# Patient Record
Sex: Female | Born: 1975
Health system: Southern US, Community
[De-identification: ages and names within clinical notes are randomized; demographics above are authoritative.]

---

## 2020-08-27 ENCOUNTER — Encounter (HOSPITAL_BASED_OUTPATIENT_CLINIC_OR_DEPARTMENT_OTHER): Payer: Self-pay | Admitting: *Deleted

## 2020-08-27 ENCOUNTER — Emergency Department (HOSPITAL_BASED_OUTPATIENT_CLINIC_OR_DEPARTMENT_OTHER): Payer: BC Managed Care – PPO

## 2020-08-27 ENCOUNTER — Emergency Department (HOSPITAL_BASED_OUTPATIENT_CLINIC_OR_DEPARTMENT_OTHER)
Admission: EM | Admit: 2020-08-27 | Discharge: 2020-08-27 | Disposition: A | Discharge status: Home / Self Care | Payer: BC Managed Care – PPO | Attending: Emergency Medicine | Admitting: Emergency Medicine

## 2020-08-27 ENCOUNTER — Other Ambulatory Visit: Payer: Self-pay

## 2020-08-27 DIAGNOSIS — Y999 Unspecified external cause status: Secondary | ICD-10-CM | POA: Diagnosis not present

## 2020-08-27 DIAGNOSIS — Y939 Activity, unspecified: Secondary | ICD-10-CM | POA: Diagnosis not present

## 2020-08-27 DIAGNOSIS — Z79899 Other long term (current) drug therapy: Secondary | ICD-10-CM | POA: Insufficient documentation

## 2020-08-27 DIAGNOSIS — Y929 Unspecified place or not applicable: Secondary | ICD-10-CM | POA: Diagnosis not present

## 2020-08-27 DIAGNOSIS — S92414A Nondisplaced fracture of proximal phalanx of right great toe, initial encounter for closed fracture: Secondary | ICD-10-CM | POA: Insufficient documentation

## 2020-08-27 DIAGNOSIS — X501XXA Overexertion from prolonged static or awkward postures, initial encounter: Secondary | ICD-10-CM | POA: Insufficient documentation

## 2020-08-27 DIAGNOSIS — S92415A Nondisplaced fracture of proximal phalanx of left great toe, initial encounter for closed fracture: Secondary | ICD-10-CM

## 2020-08-27 DIAGNOSIS — S90932A Unspecified superficial injury of left great toe, initial encounter: Secondary | ICD-10-CM | POA: Diagnosis present

## 2020-08-27 MED ORDER — HYDROCODONE-ACETAMINOPHEN 5-325 MG PO TABS: 1.0000 | ORAL_TABLET | Freq: Four times a day (QID) | ORAL | 0 refills | Status: AC | PRN

## 2020-08-27 MED ORDER — OXYCODONE-ACETAMINOPHEN 5-325 MG PO TABS
1.0000 | ORAL_TABLET | Freq: Once | ORAL | Status: AC
  Administered 2020-08-27: 1 via ORAL
  Filled 2020-08-27: qty 1

## 2020-08-27 MED FILL — HYDROCODON-APAP 5-325: 5-325 | 2 days supply | Qty: 10 | Fill #0

## 2020-08-27 NOTE — Discharge Instructions (Addendum)
While you're continuing to have severe pain, recommend using crutches for mobility. As your pain improves, recommend gentle weightbearing as tolerated. Use the fracture shoe as instructed. Please schedule follow-up appointment with orthopedic surgery. Call their number to get follow-up.  Take motrin and tylenol for pain. For breakthrough pain, can use the prescribed norco. Note this can make you drowsy and should not be taken while driving or operating heavy machinery.

## 2020-08-27 NOTE — ED Triage Notes (Signed)
Jumped from bed and injured left great toe last pm  Slight swelling and bruising

## 2020-08-27 NOTE — ED Provider Notes (Signed)
[facility] EMERGENCY DEPARTMENT Provider Note   CSN: [id number] Arrival date & time: 08/27/20  3790     History Chief Complaint  Patient presents with  . Toe Pain    Virginia Bauer is a 44 y.o. female.  Presents to emergency department with concern for left great toe pain.  Patient states that last night she stepped awkwardly onto her left great toe and had sudden onset of pain.  Has had difficulty walking due to the pain since the accident.  Pain is worse with ambulation, improved with rest.  Currently dull achy, moderate at rest.  Denies any other associated injuries, denies any head trauma, pain.  She denies any chronic medical conditions.Marland Kitchen  HPI     History reviewed. No pertinent past medical history.  There are no problems to display for this patient.   History reviewed. No pertinent surgical history.   OB History   No obstetric history on file.     No family history on file.  Social History   Tobacco Use  . Smoking status: Not on file  Substance Use Topics  . Alcohol use: Not on file  . Drug use: Not on file    Home Medications Prior to Admission medications   Medication Sig Start Date End Date Taking? Authorizing Provider  levocetirizine (XYZAL) 5 MG tablet Take 5 mg by mouth every evening.   Yes [provider]  methylphenidate (RITALIN) 10 MG tablet Take 10 mg by mouth 2 (two) times daily.   Yes [provider]  omeprazole-sodium bicarbonate (ZEGERID) 40-1100 MG capsule Take 1 capsule by mouth daily before breakfast.   Yes [provider]  HYDROcodone-acetaminophen (NORCO/VICODIN) 5-325 MG tablet Take 1 tablet by mouth every 6 (six) hours as needed for up to 3 days for moderate pain. 08/27/20 08/30/20  Milagros Loll, MD    Allergies    Vancomycin  Review of Systems   Review of Systems  All other systems reviewed and are negative.   Physical Exam Updated Vital Signs BP (!) 158/105 (BP Location: Right Arm)    Pulse (!) 131 Comment: Pt takes dose of Ritalin 3 x daily  Temp 98.2 F (36.8 C) (Oral)   Resp 18   Ht 5\' 2"  (1.575 m)   Wt 83.9 kg   SpO2 97%   BMI 33.84 kg/m   Physical Exam Vitals and nursing note reviewed.  Constitutional:      Appearance: Normal appearance.  HENT:     Head: Normocephalic and atraumatic.     Mouth/Throat:     Mouth: Mucous membranes are moist.     Pharynx: Oropharynx is clear.  Eyes:     Extraocular Movements: Extraocular movements intact.     Pupils: Pupils are equal, round, and reactive to light.  Cardiovascular:     Rate and Rhythm: Normal rate.     Pulses: Normal pulses.  Pulmonary:     Effort: Pulmonary effort is normal. No respiratory distress.  Musculoskeletal:     Cervical back: Normal range of motion. No rigidity.     Comments: LLE: there is ecchymosis and TTP at base of great toe; there is no other TTP over remainder of foot, ankle, lower or upper leg, dp/pt pulses intact, distal cap refill of great toe is brisk, sensation is intact  Skin:    General: Skin is warm and dry.     Capillary Refill: Capillary refill takes less than 2 seconds.  Neurological:     General: No  focal deficit present.     Mental Status: She is alert and oriented to person, place, and time.     ED Results / Procedures / Treatments   Labs (all labs ordered are listed, but only abnormal results are displayed) Labs Reviewed - No data to display  EKG None  Radiology DG Toe Great Left  Result Date: 08/27/2020 CLINICAL DATA:  Injury EXAM: LEFT GREAT TOE COMPARISON:  None. FINDINGS: Normal alignment with approximation of the joints. Nondisplaced intra-articular fracture involving the great toe proximal phalanx. Diffuse soft tissue swelling. No suspicious radiopaque foreign bodies. IMPRESSION: Nondisplaced intra-articular fracture involving the great toe proximal phalanx. Electronically Signed   By: Stana Bunting M.D.   On: 08/27/2020 08:54     Procedures Procedures (including critical care time)  Medications Ordered in ED Medications  oxyCODONE-acetaminophen (PERCOCET/ROXICET) 5-325 MG per tablet 1 tablet (1 tablet Oral Given 08/27/20 1914)    ED Course  I have reviewed the triage vital signs and the nursing notes.  Pertinent labs & imaging results that were available during my care of the patient were reviewed by me and considered in my medical decision making (see chart for details).    MDM Rules/Calculators/A&P                          44 year old lady presents to ER with isolated great toe injury.  Plain films demonstrating nondisplaced fracture proximal phalanx of left great toe.  Neurovascular intact, no other trauma careful inspection of extremity.  Will place in fracture shoe, provided crutches. Recommended NWB for now due to her level of pain but transition to WBAT as pain improves over the next few days or so. Recommended follow-up with sports medicine or orthopedist. Provided info for sports medicine at [facility].   After the discussed management above, the patient was determined to be safe for discharge.  The patient was in agreement with this plan and all questions regarding their care were answered.  ED return precautions were discussed and the patient will return to the ED with any significant worsening of condition.   Final Clinical Impression(s) / ED Diagnoses Final diagnoses:  Closed nondisplaced fracture of proximal phalanx of left great toe, initial encounter    Rx / DC Orders ED Discharge Orders         Ordered    HYDROcodone-acetaminophen (NORCO/VICODIN) 5-325 MG tablet  Every 6 hours PRN        08/27/20 0907           Milagros Loll, MD 08/28/20 1202

## 2020-10-01 ENCOUNTER — Telehealth: Payer: Self-pay | Admitting: Family Medicine

## 2020-10-01 NOTE — Telephone Encounter (Signed)
ED follow-up -- Called pt to offer f/u care for foot/ toe pain --no answer, left message for pt to contact office if appt still needed.  -glh

## 2021-07-29 IMAGING — CR DG TOE GREAT 2+V*L*
3 series · 3 of 3 positions shown · non-contrast
Comparison: None.

CLINICAL DATA: Injury

EXAM:
LEFT GREAT TOE

[t toes ap left]
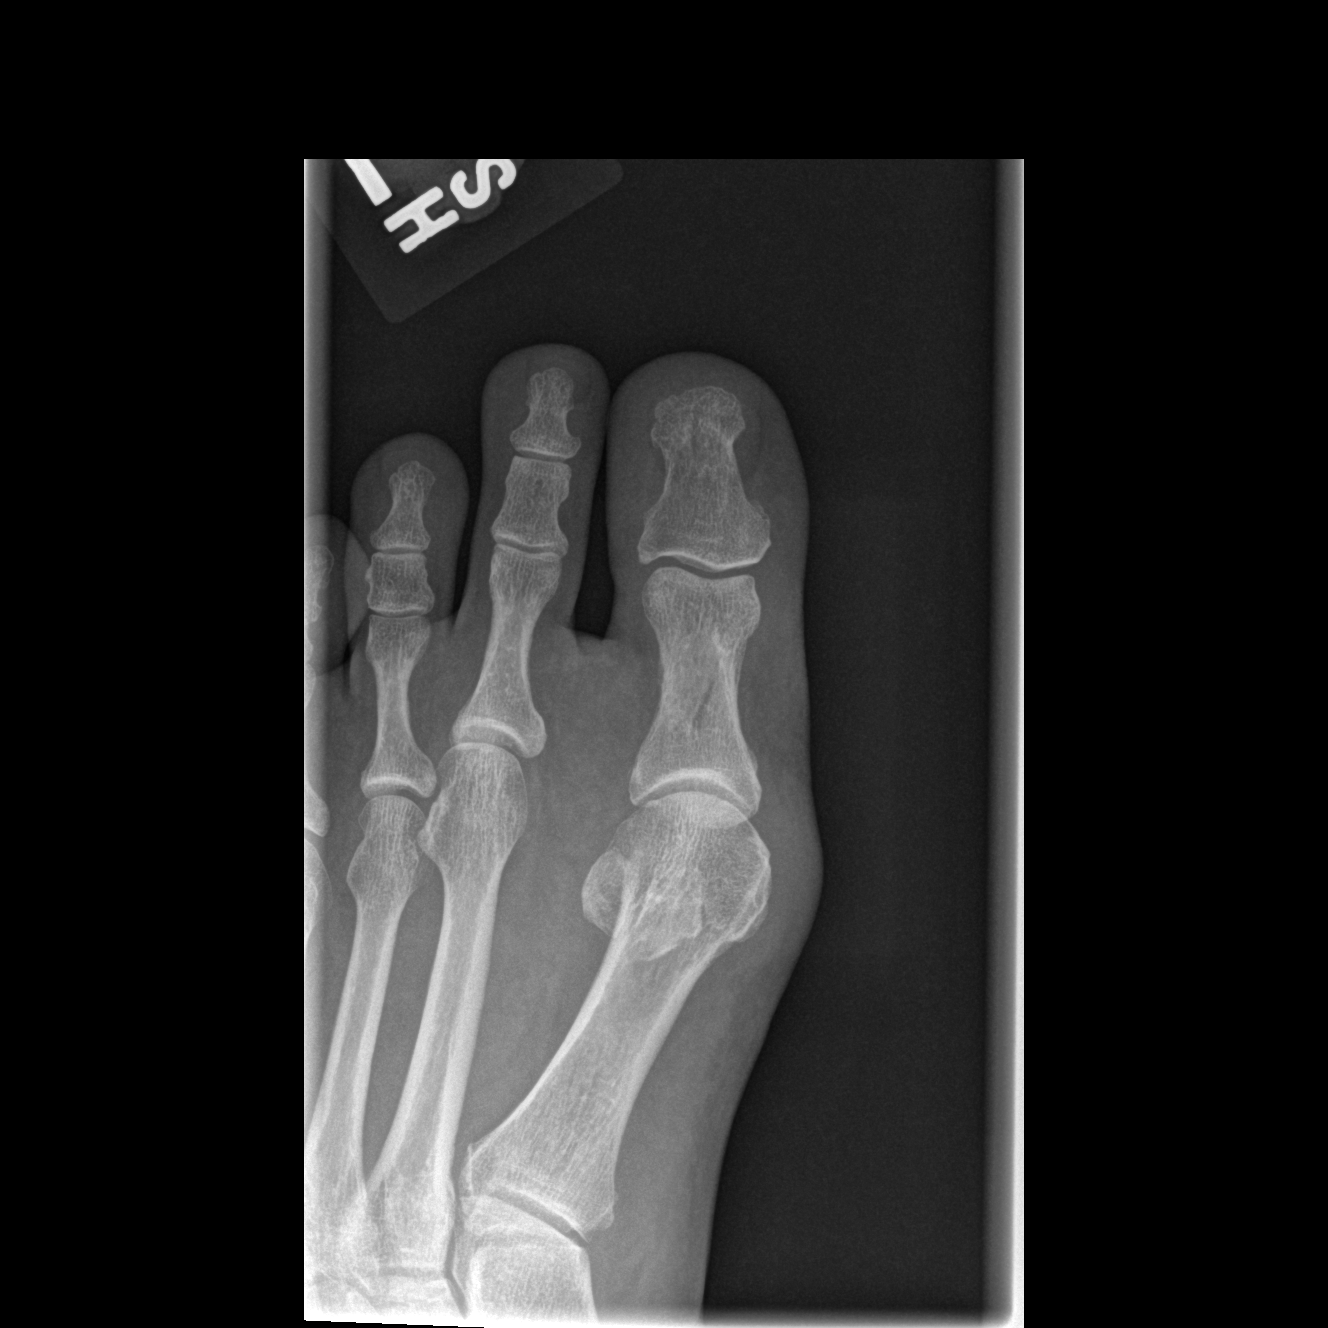

[t toes oblique left]
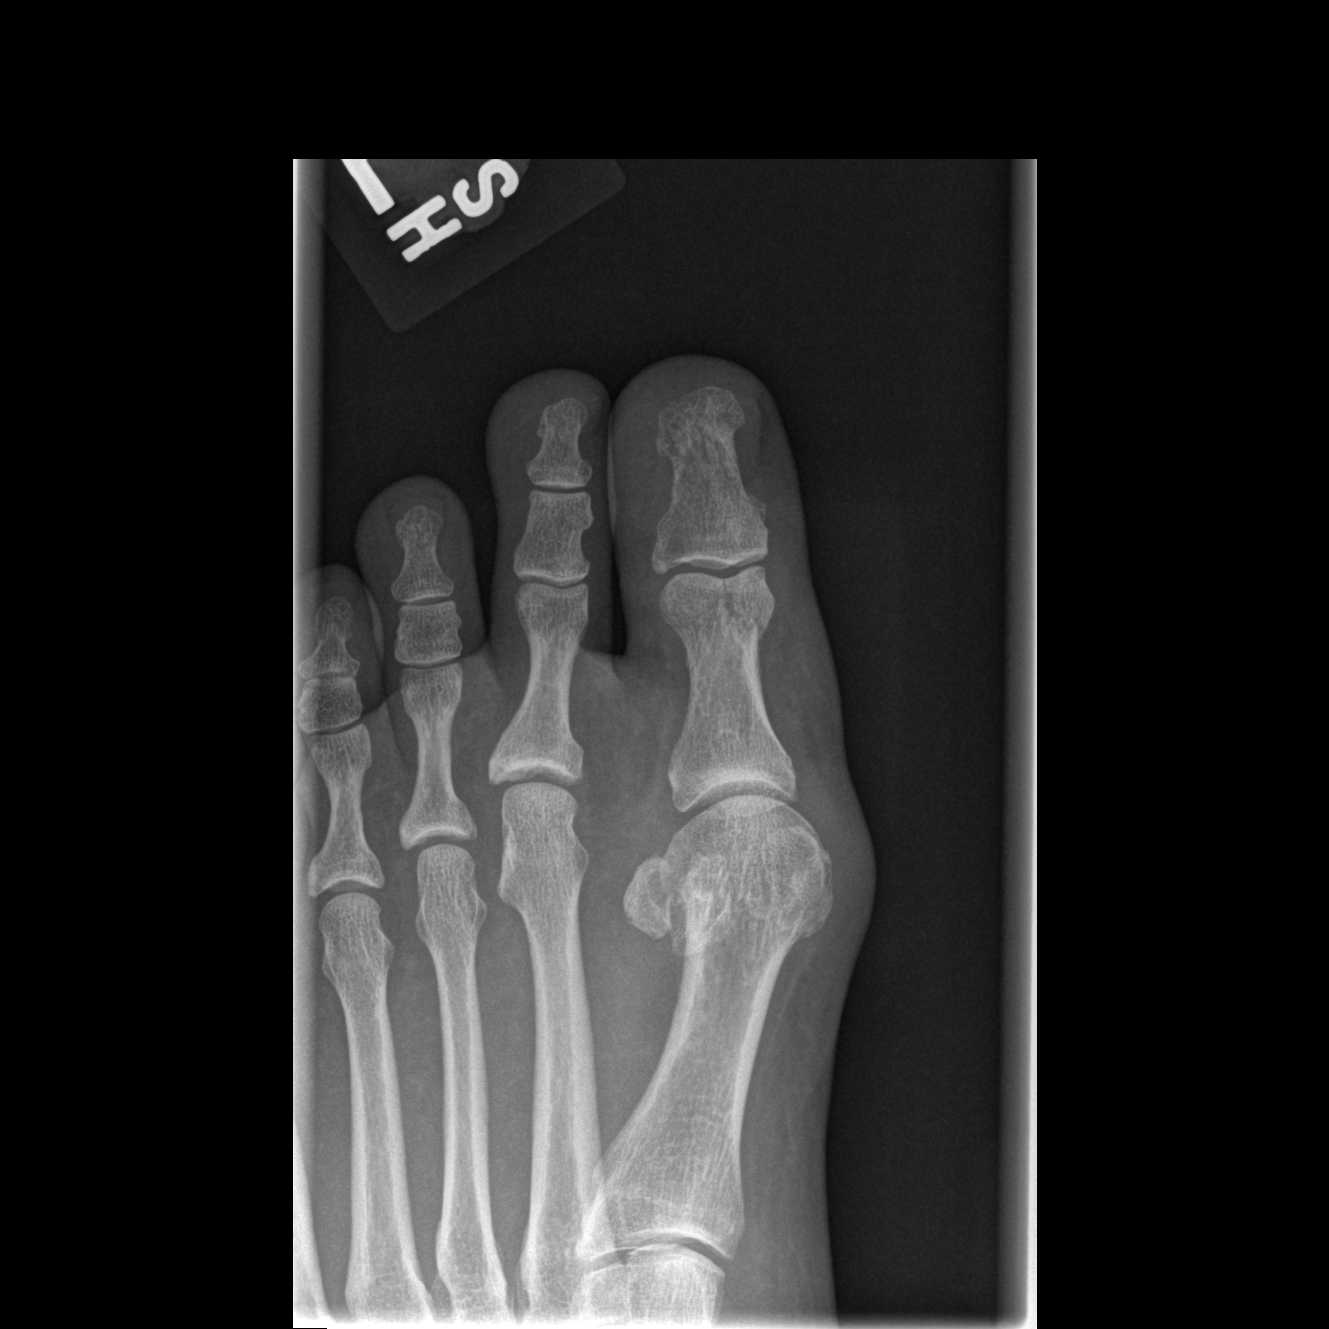

[t toes lateral left]
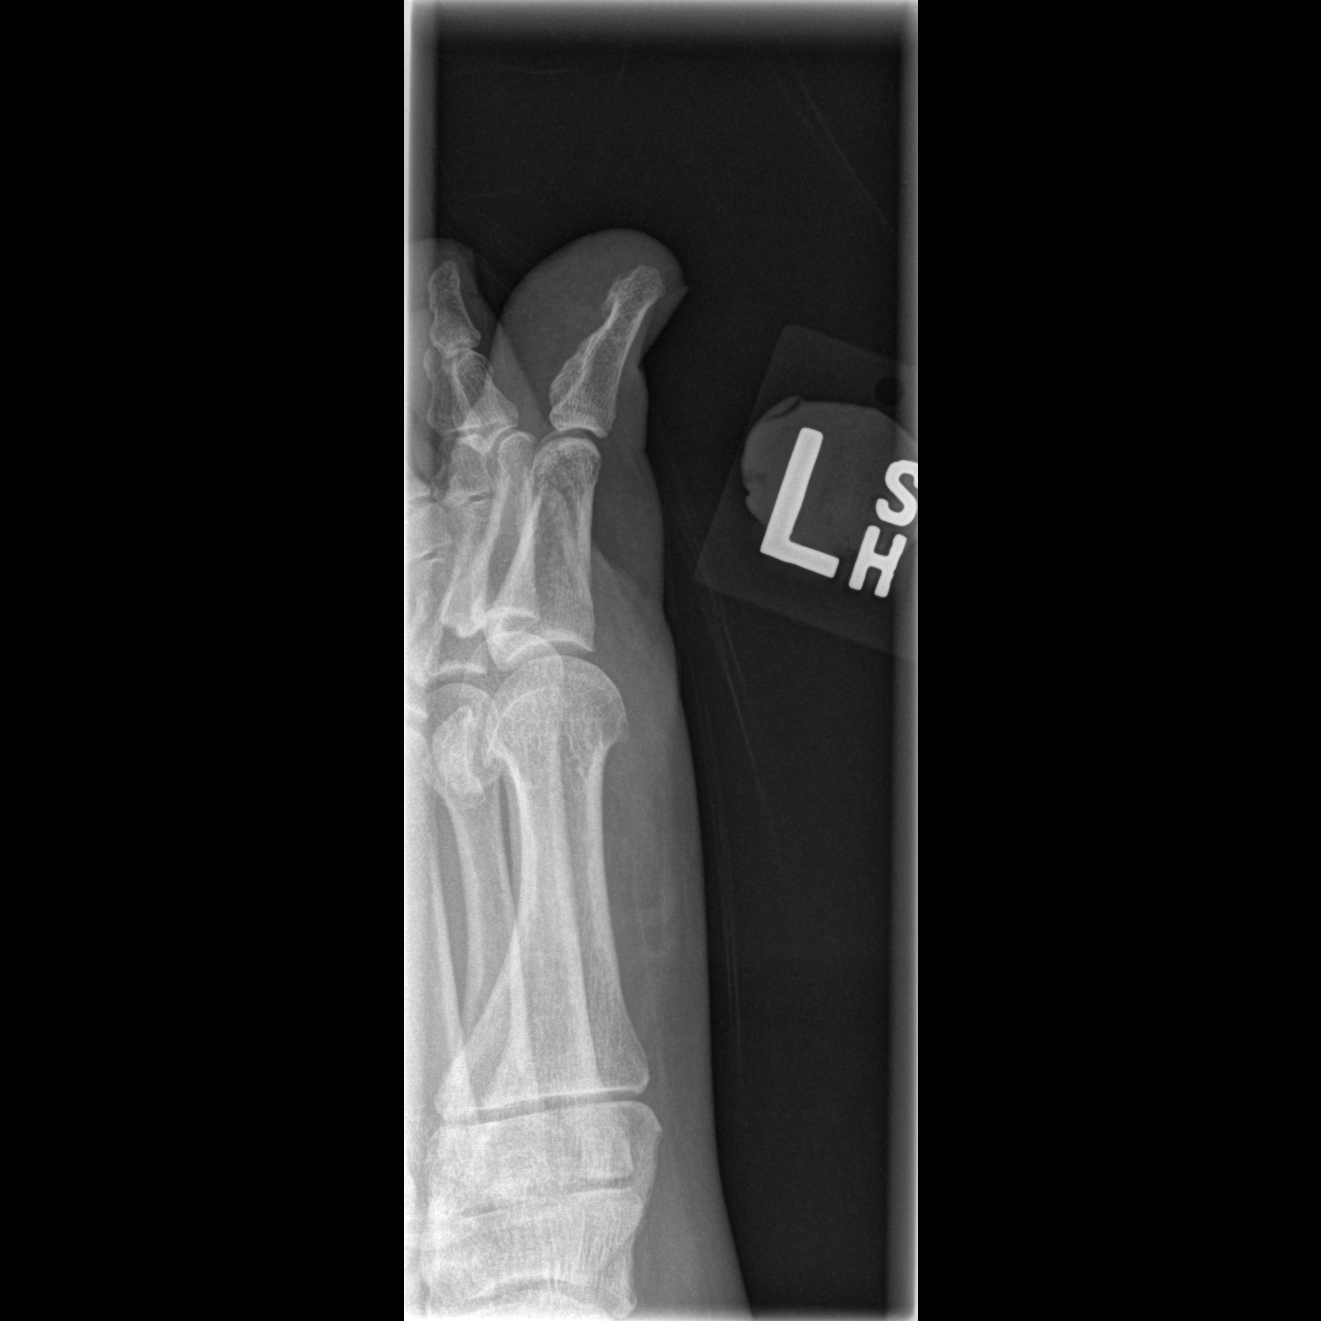

[3 of 3 positions shown; findings below may reference images not displayed]

FINDINGS: Normal alignment with approximation of the joints. Nondisplaced
intra-articular fracture involving the great toe proximal phalanx.
Diffuse soft tissue swelling. No suspicious radiopaque foreign
bodies.
IMPRESSION: Nondisplaced intra-articular fracture involving the great toe
proximal phalanx.

## 2023-04-14 ENCOUNTER — Encounter: Payer: Self-pay | Admitting: *Deleted
